# Patient Record
Sex: Male | Born: 1940 | Race: White | Hispanic: No | Marital: Married | Smoking: Former smoker
Health system: Southern US, Community
[De-identification: ages and names within clinical notes are randomized; demographics above are authoritative.]

## PROBLEM LIST (undated history)

## (undated) DIAGNOSIS — J449 Chronic obstructive pulmonary disease, unspecified: Secondary | ICD-10-CM

## (undated) DIAGNOSIS — E039 Hypothyroidism, unspecified: Secondary | ICD-10-CM

## (undated) DIAGNOSIS — G4730 Sleep apnea, unspecified: Secondary | ICD-10-CM

## (undated) DIAGNOSIS — I671 Cerebral aneurysm, nonruptured: Secondary | ICD-10-CM

## (undated) DIAGNOSIS — I219 Acute myocardial infarction, unspecified: Secondary | ICD-10-CM

## (undated) DIAGNOSIS — I10 Essential (primary) hypertension: Secondary | ICD-10-CM

## (undated) DIAGNOSIS — N400 Enlarged prostate without lower urinary tract symptoms: Secondary | ICD-10-CM

## (undated) DIAGNOSIS — I2510 Atherosclerotic heart disease of native coronary artery without angina pectoris: Secondary | ICD-10-CM

## (undated) DIAGNOSIS — C801 Malignant (primary) neoplasm, unspecified: Secondary | ICD-10-CM

## (undated) HISTORY — PX: CORONARY ANGIOPLASTY: SHX604

## (undated) HISTORY — PX: BLADDER SURGERY: SHX569

---

## 1999-11-06 DIAGNOSIS — I2510 Atherosclerotic heart disease of native coronary artery without angina pectoris: Secondary | ICD-10-CM

## 1999-11-06 HISTORY — DX: Atherosclerotic heart disease of native coronary artery without angina pectoris: I25.10

## 2007-10-29 ENCOUNTER — Encounter: Admission: RE | Admit: 2007-10-29 | Discharge: 2007-10-29 | Payer: Self-pay | Admitting: Neurosurgery

## 2007-11-06 DIAGNOSIS — I219 Acute myocardial infarction, unspecified: Secondary | ICD-10-CM

## 2007-11-06 HISTORY — DX: Acute myocardial infarction, unspecified: I21.9

## 2007-11-06 HISTORY — PX: CORONARY ARTERY BYPASS GRAFT: SHX141

## 2007-11-19 ENCOUNTER — Encounter: Payer: Self-pay | Admitting: Neurosurgery

## 2007-12-02 ENCOUNTER — Ambulatory Visit (HOSPITAL_COMMUNITY): Admission: RE | Admit: 2007-12-02 | Discharge: 2007-12-02 | Payer: Self-pay | Admitting: Interventional Radiology

## 2007-12-10 ENCOUNTER — Inpatient Hospital Stay (HOSPITAL_COMMUNITY): Admission: AD | Admit: 2007-12-10 | Discharge: 2007-12-11 | Payer: Self-pay | Admitting: Interventional Radiology

## 2007-12-30 ENCOUNTER — Encounter: Payer: Self-pay | Admitting: Interventional Radiology

## 2008-12-20 ENCOUNTER — Ambulatory Visit (HOSPITAL_COMMUNITY): Admission: RE | Admit: 2008-12-20 | Discharge: 2008-12-20 | Payer: Self-pay | Admitting: Interventional Radiology

## 2009-08-31 IMAGING — CT CT ANGIO HEAD
2 of 3 series · 11 of 30 positions shown · IV contrast (omnipaque)
Comparison: none

CLINICAL DATA: Aneurysm diagnosed by an MRI exam in [HOSPITAL].  There is no specific information about the location of the lesion on that exam.  
CT ANGIOGRAPHY OF HEAD:
TECHNIQUE: Multidetector CT imaging of the brain was performed during bolus injection of intravenous contrast.  Multiplanar CT angiographic image reconstructions were generated to evaluate cerebral vasculature centered at the circle of Willis.
Contrast:   cc Omnipaque 300

[Series 3: head w/(date) · axial · 0.49mm/px · z∈[-104,+76]mm · 8 of 48 slices shown]
[im 6/48  brain]
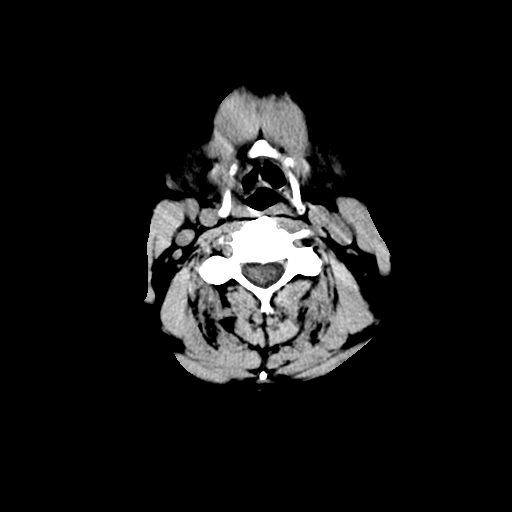
[im 11/48  bone]
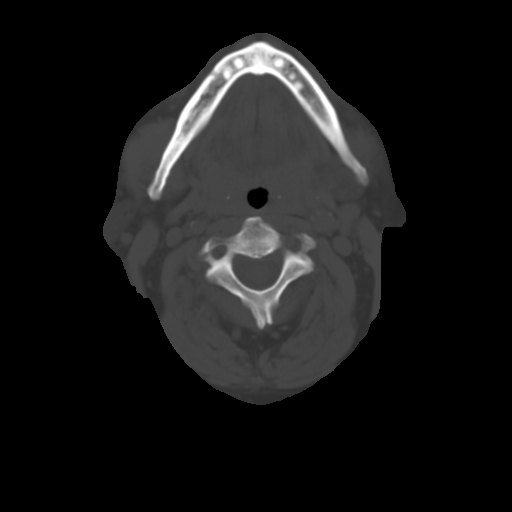
[im 16/48  brain]
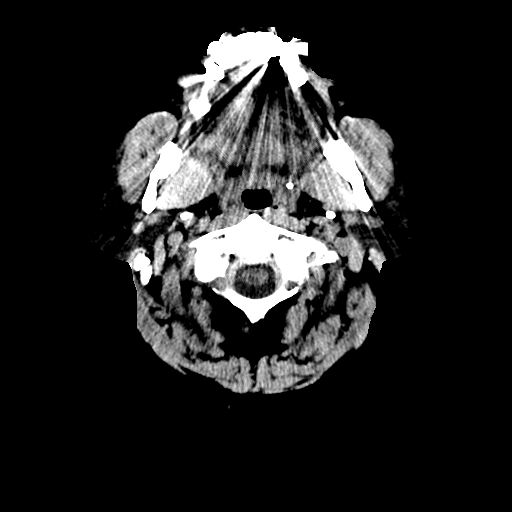
[im 21/48  bone]
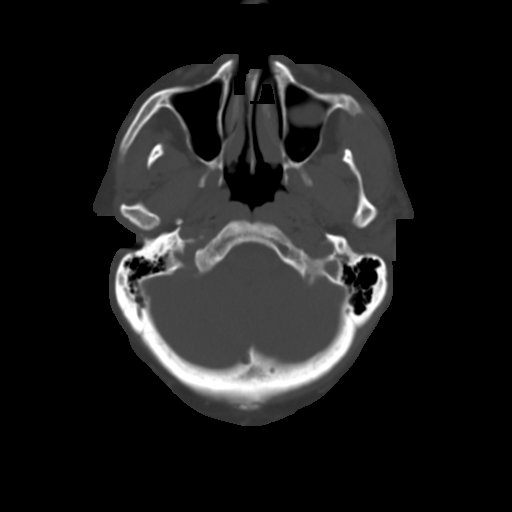
[im 27/48  brain]
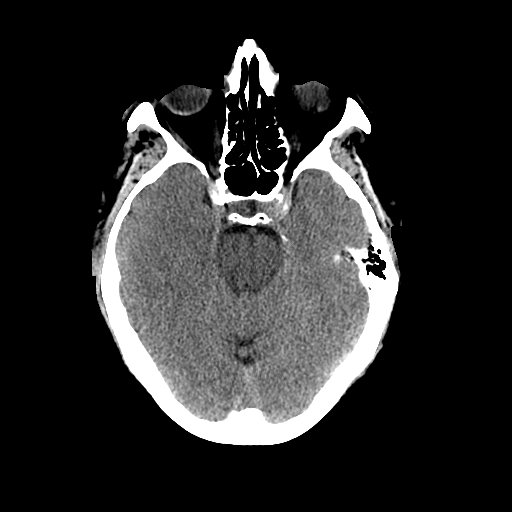
[im 32/48  bone]
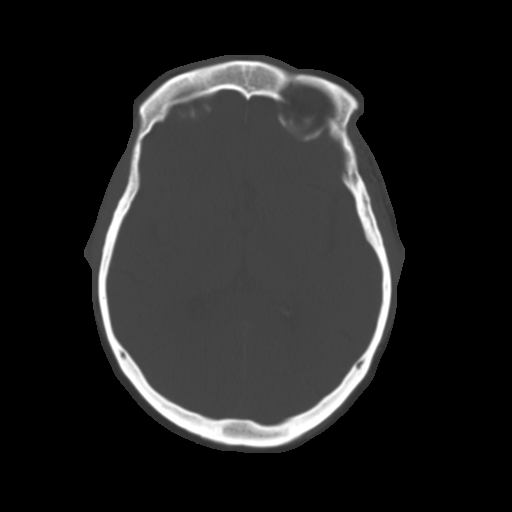
[im 37/48  brain]
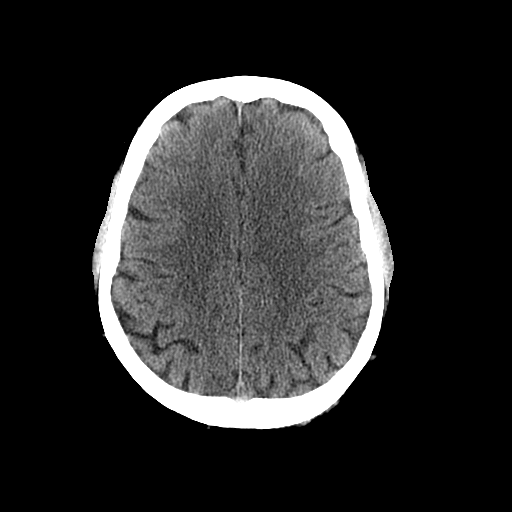
[im 42/48  bone]
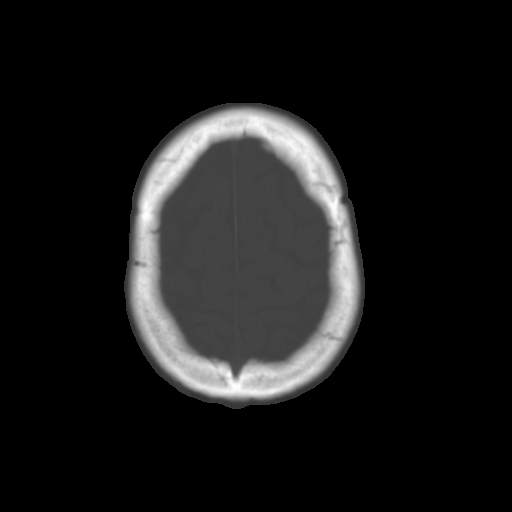

[Series 9: brain w/ · axial · 0.49mm/px · z∈[+25,+97]mm · 3 of 28 slices shown]
[im 7/28  brain]
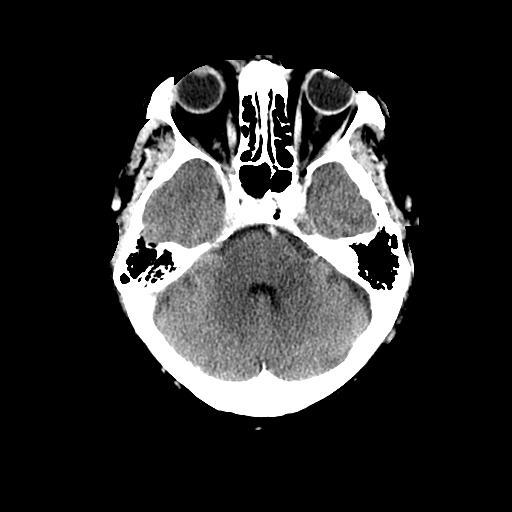
[im 14/28  brain]
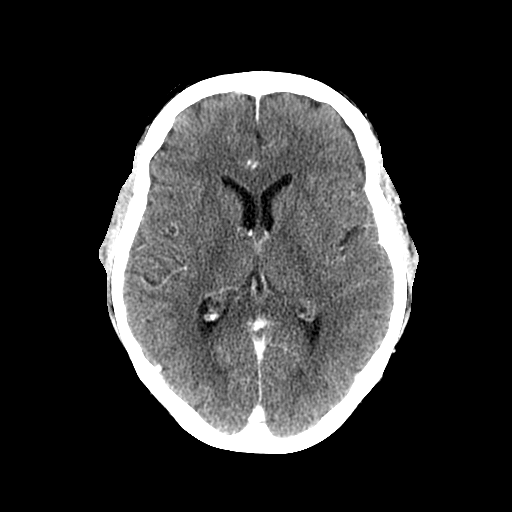
[im 21/28  brain]
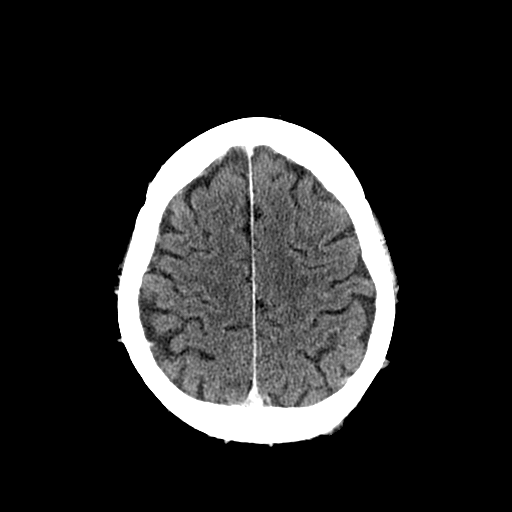

[11 of 30 positions shown; findings below may reference images not displayed]

FINDINGS: The brain itself shows no sign of accelerated atrophy or focal stroke.  Ventricular system appears normal.  No extraaxial fluid. 
Both vertebral arteries are widely patent and approximately equal in size.  There is mild atherosclerotic change of the right vertebral artery at the level of the foramen magnum but no flow limiting stenosis.  No basilar stenosis.  Posterior circulation branch vessels appear normal.  No posterior circulation aneurysm.  
The carotid bifurcations are incidentally included on the exam.  There is atherosclerotic disease at each carotid bifurcation.  Narrowing of the right ICA is maximally 20% by diameter stenosis.  The left ICA does not show a measurable luminal stenosis.  There is some atherosclerotic disease in the carotid siphon regions but no flow limiting stenosis evident.  Both anterior and middle cerebral vessels appear normal.  There is an anterior communicating artery aneurysm projecting upward and anteriorly measuring 12.4 x 8.5 x 8 mm.  The pericallosal artery on the left appears to exit directly from the aneurysm.
IMPRESSION: 12 x 8 x 8 mm anterior communicating artery projecting anteriorly.  The left pericallosal artery derives its origin from the aneurysm sac.  No second lesions are seen.

## 2010-11-26 ENCOUNTER — Encounter: Payer: Self-pay | Admitting: Neurosurgery

## 2010-11-26 ENCOUNTER — Encounter: Payer: Self-pay | Admitting: Interventional Radiology

## 2011-03-20 NOTE — Consult Note (Signed)
Richard [name], Richard [name] NO.:  [id number]   MEDICAL RECORD NO.:  [id number]          PATIENT TYPE:  OUT   LOCATION:  XRAY                         FACILITY:  [facility]   PHYSICIAN:  Sanjeev K. Deveshwar, M.D.DATE OF BIRTH:  1941-05-06   DATE OF CONSULTATION:  12/30/2007  DATE OF DISCHARGE:                                 CONSULTATION   REFERRING PHYSICIAN:  Cristi Loron, M.D.   DATE OF CONSULT:  December 30, 2007.   CHIEF COMPLAINT:  Status post stent assisted coiling of a large left  anterior communicating artery aneurysm performed December 10, 2007.   HISTORY OF PRESENT ILLNESS:  Mr. Shatswell is a very pleasant 70-year-  old male who was referred to Dr. [name] through the courtesy of Dr.  [name] after the patient was found to have a large anterior  communicating artery aneurysm which measured approximately 12 mm x 7.5  mm.  Dr. [name] met with the patient, in consultation, to discuss  treatment options.  The initial consult was performed on November 19, 2007.  Arrangements were made to have the patient return to [facility] on December 02, 2007 for treatment.  However, the patient was  somewhat anxious, at that time and a decision was made to proceed only  with the angiogram.  The angiogram did document the aneurysm as  previously noted, Dr. [name] did meet, again, with the patient in  consultation.  There was no charge for this followup visit.  At that  time the patient did decide that he definitely wanted to have the  aneurysm endovascularly treated.  He was admitted to [facility]  on December 10, 2007, and underwent stent assisted coiling of the  aneurysm without known or immediate complications.  The patient returns  today accompanied by a friend to be seen in followup.   PAST MEDICAL HISTORY SIGNIFICANT FOR:  1. Hypertension.  2. Hyperlipidemia.  3. Hypothyroidism.  4. A previous history of bladder cancer.  5. He has  a history of coronary artery disease with an MI last year      that required stent placement.  He is followed locally by Dr.      [name]  6. The patient had a the Persantine Cardiolite performed November 19, 2007, that was negative for ischemia.  His ejection fraction was      63%.   PAST SURGICAL HISTORY:  The patient has had coronary artery bypass graft  surgery, [place] believe, [place].  He has had resection  for bladder cancer.  He denies any previous problems with anesthesia.   ALLERGIES:  The patient is intolerant to codeine.   CURRENT MEDICATIONS INCLUDE:  Levothyroxine, Lipitor, atenolol, aspirin,  and Plavix.   SOCIAL HISTORY:  The patient is widowed.  He has one son.  He lives in  [place], [place].  He travels frequently.  He has a supportive  friend whom he refers to as [name].  He does have a history of tobacco  use.  He has been trying to quit.  He does not use alcohol.  He is semi-  retired.  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   FAMILY HISTORY:  His mother died at age 63 from congestive heart  failure.  His father died at age 29 from a subdural hematoma.  He has a  brother who died at age 42 from congestive heart failure.   IMPRESSION AND PLAN:  As noted, the patient returns today to be seen in  followup after his stent assisted coiling of an anterior communicating  artery aneurysm performed December 10, 2007.  Dr. [name] reviewed the  images of the aneurysm pre-and-post treatment with the patient and his  friend, [name].  All of their questions were answered.   The patient states he has had some mild occasional headaches.  He has  also noted some dizziness which is new.  His friend, [name], reports that  he has had some slurred speech, especially in the mornings although this  does appear to be improving.  The patient also reports that he has had  some numbness of his hands and his feet.  He also reports some  word-  finding difficulties.  They deny any facial weakness or any unilateral  weakness of his arms or legs.   The patient has had some weight loss.  He feels that he had some  emotional trauma because of all that he has been through recently.  He  is trying to give up tobacco.  He is down to smoking 5 cigarettes per  day.  He has been somewhat emotionally labile.   The patient is scheduled to followup with his primary care physician,  Dr. Leilani Able, tomorrow.  We have asked him to be sure to have his  blood pressure checked.  His dizziness might be coming from low blood  pressure or perhaps he is slightly dehydrated.  We have encouraged him  to drink plenty of fluids.  He does not particularly like drinking  water, but we have encouraged him to do so.  It could be that his  antihypertensive, atenolol, dosage might need to be adjusted also due to  his weight loss.  The patient might also be a candidate for an  antidepressant.  Again, we have advised him to discuss this with his  primary care physician.   Dr. [name] recommended that the patient continue on aspirin 325 mg  indefinitely.  He is to stay on the Plavix 75 mg daily for two more  weeks, then he is to change to Plavix 75 mg every other day for 2 weeks,  and then he will discontinue the Plavix.  We plan to repeat a cerebral  angiogram in approximately 4 months to further evaluate the status of  the stent, and the aneurysm which has been coiled.  The patient is in  agreement with this plan.  He has been told to call us, in the meantime,  if he has any other new neurological-type symptoms or any other  questions.  Greater than 40 minutes was spent on this visit.      Delton See, P.A.    ______________________________  Grandville Silos. [name], M.D.    DR/MEDQ  D:  12/30/2007  T:  12/30/2007  Job:  [id number]   cc:   Jocelyn Lamer D. Pecola Leisure, M.D.  Cristi Loron, M.D.
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
## 2011-07-26 LAB — DIFFERENTIAL
Basophils Absolute: 0.1
Basophils Relative: 1
Eosinophils Absolute: 0.2
Lymphocytes Relative: 9 — ABNORMAL LOW
Lymphs Abs: 0.7
Monocytes Absolute: 0.7
Neutro Abs: 6.2
Neutrophils Relative %: 79 — ABNORMAL HIGH

## 2011-07-26 LAB — BASIC METABOLIC PANEL
BUN: 15
Calcium: 8.7
Chloride: 106
GFR calc Af Amer: >60

## 2011-07-26 LAB — CBC
HCT: 44.4
Hemoglobin: 15.3
MCHC: 34.4
MCV: 91.3
RBC: 4.87
WBC: 7.8

## 2011-07-26 LAB — PROTIME-INR: Prothrombin Time: 12.5

## 2011-07-27 LAB — CBC
HCT: 34.7 — ABNORMAL LOW
Hemoglobin: 11.8 — ABNORMAL LOW
MCHC: 34
MCHC: 34.2
MCV: 90.6
MCV: 91.6
RBC: 3.79 — ABNORMAL LOW
RDW: 14.2
WBC: 8.9

## 2011-07-27 LAB — PROTIME-INR
INR: 0.9
INR: 1.1

## 2011-07-27 LAB — COMPREHENSIVE METABOLIC PANEL
ALT: 15
Albumin: 3.5
Alkaline Phosphatase: 57
BUN: 18
Chloride: 105
Potassium: 4.4
Sodium: 139
Total Bilirubin: 0.7
Total Protein: 6.5

## 2011-07-27 LAB — BASIC METABOLIC PANEL
CO2: 26
Calcium: 7.8 — ABNORMAL LOW
Chloride: 105
GFR calc Af Amer: >60
Glucose, Bld: 90
Potassium: 3.7
Sodium: 136

## 2012-05-14 ENCOUNTER — Telehealth (HOSPITAL_COMMUNITY): Payer: Self-pay

## 2012-05-14 NOTE — Telephone Encounter (Signed)
I spoke with Richard [name] in regards to his f/u angio.  He stated that he is consulting on a jobsite in [place] until Dec.  He will call once he gets back in town.

## 2015-04-07 ENCOUNTER — Other Ambulatory Visit (HOSPITAL_COMMUNITY)
Admission: RE | Admit: 2015-04-07 | Discharge: 2015-04-07 | Disposition: A | Discharge status: Home / Self Care | Payer: Medicare Other | Source: Ambulatory Visit

## 2016-10-01 ENCOUNTER — Ambulatory Visit: Payer: Self-pay | Admitting: Surgery

## 2016-10-01 NOTE — H&P (Signed)
Richard [name]. Viscomi 10/01/2016 10:41 AM Location: [facility] Patient #: [id number] DOB: 1941/07/31 Married / Language: English / Race: White Male  Patient Care Team: Michael Boston, [name] as Consulting Physician (General Surgery) Luanne Bras, [name] as Consulting Physician (Interventional Radiology) Newman Pies, [name] as Consulting Physician (Neurosurgery)   History of Present Illness Richard [name]; 10/01/2016 5:02 PM) The patient is a 75 year old male who presents with an inguinal hernia. Note for "Inguinal hernia": Patient sent for surgical consultation requested Dr. Lindon Romp from [place]. Concern for worsening left inguinal hernia.  Patient comes in today with his wife. He is usually out of town traveling for business, many weeks that time for [employer] estate. He went and got a physical examination while he was having a prolonged job in [place]. Has had a left inguinal hernia for a while. Since he is back home in [place], the hernia has been more sensitive. Surgical consultation requested. One was done in early December. They wish to be urgently seen. He was moved up to today. Notes up in [place] noticed surgeon saw him in August. Note states that he declined surgery. That annoyed him as he recalls being told observation only was appropriate. "It would be 10 years before I would need to consider hernia surgery." Initially was upset & about to leave, but then I tried to calm him down and reassure him.  Patient notes now the left groin lump is getting larger and bothering him. He is made a homemade truss that helps keep it in, but is still getting occasional pain and burning episodes. Worse after an episode of bad constipation. Bowels moving better now. He wished to consider surgery since its getting worse. No abndominal surgery. He did have cardiac bypass in the past. He still smokes but is down to at most a half a pack a day. Not on any  blood thinners. Not on even aspirin. He has never had a colonoscopy and he refuses it. He's never any history of skin infections. No abscesses that required incision and drainage. No MRSA.    Other Problems Illene [name], CMA; 10/01/2016 10:41 AM) Cancer Inguinal Hernia Myocardial infarction Thyroid Disease  Past Surgical History Richard [name], CMA; 10/01/2016 10:41 AM) Bypass Surgery for Poor Blood Flow to Legs Coronary Artery Bypass Graft  Diagnostic Studies History Richard [name], CMA; 10/01/2016 10:41 AM) Colonoscopy >10 years ago  Allergies Richard [name], CMA; 10/01/2016 10:42 AM) No Known Drug Allergies 10/01/2016  Medication History (Richard [name], CMA; 10/01/2016 10:42 AM) Synthroid (112MCG Tablet, Oral) Active. Medications Reconciled  Social History Illene [name], CMA; 10/01/2016 10:41 AM) Caffeine use Coffee. Tobacco use Current some day smoker.  Family History Illene [name], CMA; 10/01/2016 10:41 AM) Cancer Brother. Heart Disease Brother, Mother. Heart disease in male family member before age 65 Heart disease in male family member before age 31     Review of Systems Illene [name] CMA; 10/01/2016 10:41 AM) General Not Present- Appetite Loss, Chills, Fatigue, Fever, Night Sweats, Weight Gain and Weight Loss. Skin Not Present- Change in Wart/[name], Dryness, Hives, Jaundice, New Lesions, Non-Healing Wounds, Rash and Ulcer. HEENT Not Present- Earache, Hearing Loss, Hoarseness, Nose Bleed, Oral Ulcers, Ringing in the Ears, Seasonal Allergies, Sinus Pain, Sore Throat, Visual Disturbances, Wears glasses/contact lenses and Yellow Eyes. Respiratory Not Present- Bloody sputum, Chronic Cough, Difficulty Breathing, Snoring and Wheezing. Breast Not Present- Breast Mass, Breast Pain, Nipple Discharge and Skin Changes. Cardiovascular Not Present- Chest Pain, Difficulty Breathing Lying Down, Leg Cramps, Palpitations,  Rapid Heart Rate,  Shortness of Breath and Swelling of Extremities. Gastrointestinal Present- Change in Bowel Habits. Not Present- Abdominal Pain, Bloating, Bloody Stool, Chronic diarrhea, Constipation, Difficulty Swallowing, Excessive gas, Gets full quickly at meals, Hemorrhoids, Indigestion, Nausea, Rectal Pain and Vomiting. Male Genitourinary Not Present- Blood in Urine, Change in Urinary Stream, Frequency, Impotence, Nocturia, Painful Urination, Urgency and Urine Leakage.  Vitals (Richard [name] CMA; 10/01/2016 10:42 AM) 10/01/2016 10:41 AM Weight: 188 lb Height: 71in Body Surface Area: 2.05 m Body Mass Index: 26.22 kg/m  Pulse: 66 (Regular)  BP: 140/80 (Sitting, Left Arm, Standard)      Physical Exam Richard [name]; 10/01/2016 4:55 PM)  General Mental Status-Alert. General Appearance-Not in acute distress, Not Sickly. Orientation-Oriented X3. Hydration-Well hydrated. Voice-Normal.  Integumentary Global Assessment Upon inspection and palpation of skin surfaces of the - Axillae: non-tender, no inflammation or ulceration, no drainage. and Distribution of scalp and body hair is normal. General Characteristics Temperature - normal warmth is noted.  Head and Neck Head-normocephalic, atraumatic with no lesions or palpable masses. Face Global Assessment - atraumatic, no absence of expression. Neck Global Assessment - no abnormal movements, no bruit auscultated on the right, no bruit auscultated on the left, no decreased range of motion, non-tender. Trachea-midline. Thyroid Gland Characteristics - non-tender.  Eye Eyeball - Left-Extraocular movements intact, No Nystagmus. Eyeball - Right-Extraocular movements intact, No Nystagmus. Cornea - Left-No Hazy. Cornea - Right-No Hazy. Sclera/Conjunctiva - Left-No scleral icterus, No Discharge. Sclera/Conjunctiva - Right-No scleral icterus, No Discharge. Pupil - Left-Direct reaction to light normal. Pupil  - Right-Direct reaction to light normal.  ENMT Ears Pinna - Left - no drainage observed, no generalized tenderness observed. Right - no drainage observed, no generalized tenderness observed. Nose and Sinuses External Inspection of the Nose - no destructive lesion observed. Inspection of the nares - Left - quiet respiration. Right - quiet respiration. Mouth and Throat Lips - Upper Lip - no fissures observed, no pallor noted. Lower Lip - no fissures observed, no pallor noted. Nasopharynx - no discharge present. Oral Cavity/Oropharynx - Tongue - no dryness observed. Oral Mucosa - no cyanosis observed. Hypopharynx - no evidence of airway distress observed.  Chest and Lung Exam Inspection Movements - Normal and Symmetrical. Accessory muscles - No use of accessory muscles in breathing. Palpation Palpation of the chest reveals - Non-tender. Auscultation Breath sounds - Normal and Clear.  Cardiovascular Auscultation Rhythm - Regular. Murmurs & Other Heart Sounds - Auscultation of the heart reveals - No Murmurs and No Systolic Clicks.  Abdomen Inspection Inspection of the abdomen reveals - No Visible peristalsis and No Abnormal pulsations. Umbilicus - No Bleeding, No Urine drainage. Palpation/Percussion Palpation and Percussion of the abdomen reveal - Soft, Non Tender, No Rebound tenderness, No Rigidity (guarding) and No Cutaneous hyperesthesia. Note: Mild diastasis recti. Small umbilical hernia. Mild left subxiphoid bulging but no definite hernia. Can feel stitches from his prior CABG. Abdomen soft. Nontender, nondistended. No guarding. No umbilical no other hernias  Male Genitourinary Sexual Maturity Tanner 5 - Adult hair pattern and Adult penile size and shape. Note: Obvious left inguinal hernia. Sensitive but reducible. No definite right inguinal hernia. Normal external genitalia. Epididymi, testes, and spermatic cords normal without any masses.  Peripheral Vascular Upper  Extremity Inspection - Left - No Cyanotic nailbeds, Not Ischemic. Right - No Cyanotic nailbeds, Not Ischemic.  Neurologic Neurologic evaluation reveals -normal attention span and ability to concentrate, able to name objects and repeat phrases. Appropriate fund of knowledge , normal  sensation and normal coordination. Mental Status Affect - not angry, not paranoid. Cranial Nerves-Normal Bilaterally. Gait-Normal.  Neuropsychiatric Mental status exam performed with findings of-able to articulate well with normal speech/language, rate, volume and coherence, thought content normal with ability to perform basic computations and apply abstract reasoning and no evidence of hallucinations, delusions, obsessions or homicidal/suicidal ideation.  Musculoskeletal Global Assessment Spine, Ribs and Pelvis - no instability, subluxation or laxity. Right Upper Extremity - no instability, subluxation or laxity.  Lymphatic Head & Neck  General Head & Neck Lymphatics: Bilateral - Description - No Localized lymphadenopathy. Axillary  General Axillary Region: Bilateral - Description - No Localized lymphadenopathy. Femoral & Inguinal  Generalized Femoral & Inguinal Lymphatics: Left - Description - No Localized lymphadenopathy. Right - Description - No Localized lymphadenopathy.    Assessment & Plan Richard [name]; [phone] PM)  UMBILICAL HERNIA WITHOUT OBSTRUCTION AND WITHOUT GANGRENE (K42.9) Impression: Small but definite umbilical hernia with some mild diastases recti. Could benefit from concurrent primary repair.  I thought he might of had a hernia in the subxiphoid since he had a lump there but it looks like it is just stitches from his prior sternotomy incision and not truly a hernia there.  LEFT INGUINAL HERNIA (K40.90) Impression: Obvious left inguinal hernia. Sensitive but reducible. No strong evidence of right inguinal hernia, but somewhat sensitive. I would have a low  threshold to explore that as well.  Reasonable for laparoscopic preperitoneal approach. The patient had done a lot of online research about different types of surgeries. He had a lot of questions about techniques. concerns for mesh. While I noted that no technique is zero risk, risk of hernia recurrence is much less with mesh and repair is safe. I noted I worked hard to get good results with a laparoscopic preperitoneal approach and would not like to alter that unless there is a strong indication. It is reasonable for him to get a second opinion but by the end of the visit he felt comfortable to trust my opinion and have me as a Psychologist, sport and exercise.  I noted to him that his risks of surgery are far higher because he smokes than any use of mesh. I encouraged him to try and quit smoking. Don't know if he will but he will least consider it.  We got off on the wrong foot at first. I think a lot of it comes from the fact that the notes from the surgeon up in [place] through [facility] care of your where state that the patient declined surgery. I noted if the patient doesn't remember that way, I was not there saw have to trust with the patient says, just as he needs to trust my opinion. Had a long discussion with him. I think I won him over and he feels comfortable with proceeding with surgery. I gave him my card and noted if he has further questions or concerns to let me know. His wife is anxious to get this done as soon as possible. I noted that I already overbooked this late in the year, but I will see what I can do. He is not incarcerated or strangulated and he did not seem to be in unbearable pain today, but we will look for an opening earlier than usual  DIASTASIS RECTI (M62.08)  Current Plans Pt Education - CCS Diastasis Recti: discussed with patient and provided information. PREOP - ING HERNIA - ENCOUNTER FOR PREOPERATIVE EXAMINATION FOR GENERAL SURGICAL PROCEDURE (Z01.818)  Current Plans You are being  scheduled for  surgery - Our schedulers will call you.  You should hear from our office's scheduling department within 5 working days about the location, date, and time of surgery. We try to make accommodations for patient's preferences in scheduling surgery, but sometimes the OR schedule or the surgeon's schedule prevents [place] from making those accommodations.  If you have not heard from our office [phone]) in 5 working days, call the office and ask for your surgeon's nurse.  If you have other questions about your diagnosis, plan, or surgery, call the office and ask for your surgeon's nurse.  Written instructions provided The anatomy & physiology of the abdominal wall and pelvic floor was discussed. The pathophysiology of hernias in the inguinal and pelvic region was discussed. Natural history risks such as progressive enlargement, pain, incarceration, and strangulation was discussed. Contributors to complications such as smoking, obesity, diabetes, prior surgery, etc were discussed.  I feel the risks of no intervention will lead to serious problems that outweigh the operative risks; therefore, I recommended surgery to reduce and repair the hernia. I explained laparoscopic techniques with possible need for an open approach. I noted usual use of mesh to patch and/or buttress hernia repair  Risks such as bleeding, infection, abscess, need for further treatment, heart attack, death, and other risks were discussed. I noted a good likelihood this will help address the problem. Goals of post-operative recovery were discussed as well. Possibility that this will not correct all symptoms was explained. I stressed the importance of low-impact activity, aggressive pain control, avoiding constipation, & not pushing through pain to minimize risk of post-operative chronic pain or injury. Possibility of reherniation was discussed. We will work to minimize complications.  An educational handout further explaining the pathology  & treatment options was given as well. Questions were answered. The patient expresses understanding & wishes to proceed with surgery.  Pt Education - Pamphlet Given - Laparoscopic Hernia Repair: discussed with patient and provided information. Pt Education - CCS Pain Control (Merrel Crabbe) Pt Education - C[facility] Hernia Post-Op HCI (Airianna Kreischer): discussed with patient and provided information. TOBACCO ABUSE (Z72.0)  Current Plans Pt Education - CCS STOP SMOKING!  Richard Hector, M.D., F.A.C.S. Gastrointestinal and Minimally Invasive Surgery [facility] [address], [place], [place] [address] [phone] Main / Paging

## 2016-10-15 ENCOUNTER — Inpatient Hospital Stay (HOSPITAL_COMMUNITY): Admission: RE | Admit: 2016-10-15 | Payer: Medicare Other | Source: Ambulatory Visit

## 2016-10-18 ENCOUNTER — Ambulatory Visit (HOSPITAL_COMMUNITY): Admission: RE | Admit: 2016-10-18 | Payer: Medicare Other | Source: Ambulatory Visit | Admitting: Surgery

## 2016-10-18 ENCOUNTER — Encounter (HOSPITAL_COMMUNITY): Admission: RE | Payer: Self-pay | Source: Ambulatory Visit

## 2016-10-18 SURGERY — REPAIR, HERNIA, INGUINAL, BILATERAL, LAPAROSCOPIC
Anesthesia: General | Laterality: Bilateral

## 2016-10-19 ENCOUNTER — Other Ambulatory Visit: Payer: Self-pay | Admitting: Surgery

## 2016-11-09 ENCOUNTER — Encounter (HOSPITAL_COMMUNITY): Payer: Self-pay

## 2016-11-09 ENCOUNTER — Other Ambulatory Visit (HOSPITAL_COMMUNITY): Payer: Self-pay | Admitting: *Deleted

## 2016-11-09 ENCOUNTER — Encounter (HOSPITAL_COMMUNITY)
Admission: RE | Admit: 2016-11-09 | Discharge: 2016-11-09 | Disposition: A | Discharge status: Home / Self Care | Payer: Medicare Other | Source: Ambulatory Visit | Attending: Orthopaedic Surgery | Admitting: Orthopaedic Surgery

## 2016-11-09 ENCOUNTER — Encounter (HOSPITAL_COMMUNITY): Payer: Self-pay | Admitting: Vascular Surgery

## 2016-11-09 DIAGNOSIS — Z01810 Encounter for preprocedural cardiovascular examination: Secondary | ICD-10-CM | POA: Diagnosis present

## 2016-11-09 DIAGNOSIS — K4090 Unilateral inguinal hernia, without obstruction or gangrene, not specified as recurrent: Secondary | ICD-10-CM | POA: Diagnosis not present

## 2016-11-09 DIAGNOSIS — Z01812 Encounter for preprocedural laboratory examination: Secondary | ICD-10-CM | POA: Diagnosis not present

## 2016-11-09 DIAGNOSIS — I10 Essential (primary) hypertension: Secondary | ICD-10-CM | POA: Diagnosis not present

## 2016-11-09 HISTORY — DX: Malignant (primary) neoplasm, unspecified: C80.1

## 2016-11-09 HISTORY — DX: Chronic obstructive pulmonary disease, unspecified: J44.9

## 2016-11-09 HISTORY — DX: Hypothyroidism, unspecified: E03.9

## 2016-11-09 HISTORY — DX: Acute myocardial infarction, unspecified: I21.9

## 2016-11-09 HISTORY — DX: Sleep apnea, unspecified: G47.30

## 2016-11-09 HISTORY — DX: Essential (primary) hypertension: I10

## 2016-11-09 HISTORY — DX: Benign prostatic hyperplasia without lower urinary tract symptoms: N40.0

## 2016-11-09 HISTORY — DX: Cerebral aneurysm, nonruptured: I67.1

## 2016-11-09 HISTORY — DX: Atherosclerotic heart disease of native coronary artery without angina pectoris: I25.10

## 2016-11-09 LAB — CBC
HEMATOCRIT: 43.1 % (ref 39.0–52.0)
Hemoglobin: 14.6 g/dL (ref 13.0–17.0)
MCH: 30.5 pg (ref 26.0–34.0)
MCHC: 33.9 g/dL (ref 30.0–36.0)
MCV: 90 fL (ref 78.0–100.0)
PLATELETS: 230 10*3/uL (ref 150–400)
RBC: 4.79 MIL/uL (ref 4.22–5.81)
RDW: 14 % (ref 11.5–15.5)
WBC: 8 10*3/uL (ref 4.0–10.5)

## 2016-11-09 LAB — BASIC METABOLIC PANEL
Anion gap: 7 (ref 5–15)
BUN: 15 mg/dL (ref 6–20)
CO2: 29 mmol/L (ref 22–32)
CREATININE: 0.99 mg/dL (ref 0.61–1.24)
Calcium: 9.4 mg/dL (ref 8.9–10.3)
Chloride: 103 mmol/L (ref 101–111)
GFR calc Af Amer: >60 mL/min (ref >60)
GLUCOSE: 99 mg/dL (ref 65–99)
POTASSIUM: 3.7 mmol/L (ref 3.5–5.1)
Sodium: 139 mmol/L (ref 135–145)

## 2016-11-09 NOTE — Progress Notes (Signed)
Per records noted from Care Everywhere, pt has had a 1 vessel CABG and a stent sometime after. Pt states the CABG was somewhere around [place] in [place], [place] he thinks. Pt travels all over the country and has doctors in different places. Pt got agitated with me when I asked if he has a cardiologist and if he had had a recent stress test. He wanted to know why I was so concerned about his heart, he stated that "my heart is just fine, I had a complete check up a few months ago" I asked him if he was talking about his visits with Dr. Ellard Artis in [place], he stated "yes". I told him that Dr. Sabra Heck only saw him for stopping smoking and gave him the diagnosis of emphysema. Pt got more irritated and once again said " my heart is fine". He couldn't tell me the name of the hospital in [place], [place] that he went to and had a cardiac stent done in 2008 or 2009. Pt denies chest pain or sob. He says he's very healthy for a 76 year old. When asked if he was still taking Atenolol for his BP, he stated "when I think I need it" (he had not given that med to the pharmacy tech when they called for his medications). He then changed his answer to "I take it most days". Instructed pt to take it as instructed and to be sure to take it the day of surgery. Pt does state that he has quit smoking since May.

## 2016-11-09 NOTE — Pre-Procedure Instructions (Addendum)
TATE BUCKALEW  11/09/2016    Your procedure is scheduled on Monday, November 12, 2016 at 3:00 PM.   Report to [facility] Entrance "A" Admitting Office at 1:00 PM.   Call this number if you have problems the morning of surgery: [phone]   Remember:  Do not eat food or drink liquids after midnight Sunday, 11/11/16.  Take these medicines the morning of surgery with A SIP OF WATER: Levothyroxine (Synthroid)  Stop Multivitamins, Fish Oil and Garlic as of today. Do not use NSAIDS (Ibuprofen, Aleve, etc.) or Aspirin products prior to surgery.  Do NOT smoke 24 hours prior to surgery.   Do not wear jewelry.  Do not wear lotions, powders, or cologne.  Men may shave face and neck.  Do not bring valuables to the hospital.  [facility] is not responsible for any belongings or valuables.  Contacts, dentures or bridgework may not be worn into surgery.  Leave your suitcase in the car.  After surgery it may be brought to your room.  For patients admitted to the hospital, discharge time will be determined by your treatment team.  Patients discharged the day of surgery will not be allowed to drive home.   Special instructions:  [facility] - Preparing for Surgery  Before surgery, you can play an important role.  Because skin is not sterile, your skin needs to be as free of germs as possible.  You can reduce the number of germs on you skin by washing with CHG (chlorahexidine gluconate) soap before surgery.  CHG is an antiseptic cleaner which kills germs and bonds with the skin to continue killing germs even after washing.  Please DO NOT use if you have an allergy to CHG or antibacterial soaps.  If your skin becomes reddened/irritated stop using the CHG and inform your nurse when you arrive at Short Stay.  Do not shave (including legs and underarms) for at least 48 hours prior to the first CHG shower.  You may shave your face.  Please follow these instructions carefully:   1.  Shower  with CHG Soap the night before surgery and the                    morning of Surgery.  2.  If you choose to wash your hair, wash your hair first as usual with your       normal shampoo.  3.  After you shampoo, rinse your hair and body thoroughly to remove the shampoo.  4.  Use CHG as you would any other liquid soap.  You can apply chg directly       to the skin and wash gently with scrungie or a clean washcloth.  5.  Apply the CHG Soap to your body ONLY FROM THE NECK DOWN.        Do not use on open wounds or open sores.  Avoid contact with your eyes, ears, mouth and genitals (private parts).  Wash genitals (private parts) with your normal soap.  6.  Wash thoroughly, paying special attention to the area where your surgery        will be performed.  7.  Thoroughly rinse your body with warm water from the neck down.  8.  DO NOT shower/wash with your normal soap after using and rinsing off       the CHG Soap.  9.  Pat yourself dry with a clean towel.  10.  Wear clean pajamas.            11.  Place clean sheets on your bed the night of your first shower and do not        sleep with pets.  Day of Surgery  Do not apply any lotions the morning of surgery.  Please wear clean clothes to the hospital.   Please read over the fact sheets that you were given.

## 2016-11-09 NOTE — Progress Notes (Signed)
Anesthesia Chart Review: Patient is a 76 year old male scheduled for laparoscopic left inguinal hernia repair with possible right inguinal hernia repair with mesh, umbilical hernia repair on 11/12/16 by Dr. Coralie Keens. It looks like this case was initially scheduled on 10/18/16 with Dr. Johney Maine at [facility] but patient canceled prior to surgery (or PAT) to get a second opinion with Dr. Ninfa Linden. It had originally been rescheduled at [facility], but anesthesiologist Dr. Melrose Nakayama said case should be done in a hospital due to patient's known cardiac history. PAT was this morning.  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  Last primary care visit seen is with Ellard Artis, PA-C with [facility] in [place], [place]. 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 Because of his frequent travels, he has seem multiple different doctors in different states and facilities. He was unable to give specifics.   Meds include vitamin C, atenolol, garlic, levothyroxine, MVI, Nicoderm patch, fish oil.   BP (!) 148/71   Pulse 72   Temp 36.4 C   Resp 20   Ht 5' 11.25" (1.81 m)   Wt 192 lb 4.8 oz (87.2 kg)   SpO2 97%   BMI 26.63 kg/m   EKG 11/09/16: NSR, right BBB. There is no comparison tracings in [employer] or [facility].  By Dr. Ihor Gully notes from 12/10/07, "The patient is followed locally now by Dr. Shelva Majestic.  He had a Persantine Cardiolite performed November 19, 2007 in anticipation of the aneurysm treatment.  The Cardiolite was negative for ischemia.  His ejection fraction was 63%."  02/27/16 US Abdominal Aorta ([facility]; Care Everywhere): FINDINGS: Abdominal Aorta:Atherosclerotic irregularity in the lumen. No aneurysm.The  caliber is as follows: proximal 2.4 cm,mid 2.5 cm, and distal 2.3 cm.  Right Common Iliac Artery: Mild dilatation; it measures 1.5 cm.  Left Common Iliac Artery: Moderate dilatation; it measures 1.8 cm. IMPRESSION:   1.Atherosclerosis. No evidence of abdominal aortic aneurysm. 2. Dilatation of each common iliac artery as above.   CT Chest lung cancer screening 02/27/16 [facility]; Care Everywhere): IMPRESSION: 1. Scattered small and very small peripheral lung nodules. The Fleischner Society recommendations for a nodule measuring less than or equal to 4 mm [id number] Patient:Follow-up CT at 12 months; if unchanged, no further follow-up. 2. Bilateral emphysematous changes. 3. Atherosclerosis and previous CABG.  PFTs 03/22/16 [facility]; Care Everywhere): Spirometry shows moderately severe airflow obstruction. There is no significant improvement in FEV1 or FVC after bronchodilator. Flow volume loop: There is scooping of the expiratory limb of the flow volume loop Lung volumes: Total lung capacity is normal, Residual volume is normal and  RV/TLC is increased consistent with air-trapping. DLCO is mildly reduced, and is corrected for alveolar volume. 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  CBC and BMET today were WNL.   Reviewed above with anesthesiologist Dr. Linna Caprice. Patient with reported history of CAD, CABG, stent. He denied CP and SOB. He does not report a current PCP or cardiologist. Currently, last cardiology records that I have access to are from  2009. Surgery was moved from [facility] due to cardiac history, but it does not appear that anyone referred him for evaluation. Preoperative cardiology evaluation recommended. CCS  triage nurse Sunday Spillers notified.  George Hugh [facility] Short Stay Center/Anesthesiology Phone [phone] 11/09/2016 12:24 PM

## 2016-11-12 ENCOUNTER — Encounter (HOSPITAL_COMMUNITY): Admission: RE | Payer: Self-pay | Source: Ambulatory Visit

## 2016-11-12 ENCOUNTER — Ambulatory Visit (HOSPITAL_COMMUNITY): Admission: RE | Admit: 2016-11-12 | Payer: Medicare Other | Source: Ambulatory Visit | Admitting: Surgery

## 2016-11-12 SURGERY — REPAIR, HERNIA, INGUINAL, BILATERAL, LAPAROSCOPIC
Anesthesia: General

## 2016-11-13 ENCOUNTER — Telehealth: Payer: Self-pay | Admitting: Cardiology

## 2016-11-13 NOTE — Progress Notes (Deleted)
HPI: 76 yo male for preoperative evaluation prior to laparoscopic repair of left inguinal hernia.   Current Outpatient Prescriptions  Medication Sig Dispense Refill  . Ascorbic Acid (VITAMIN C) 1000 MG tablet Take 1,000 mg by mouth daily.    Marland Kitchen atenolol (TENORMIN) 25 MG tablet Take 25 mg by mouth daily. Pt takes it as needed    . GARLIC PO Take 2 tablets by mouth daily.    Marland Kitchen levothyroxine (SYNTHROID, LEVOTHROID) 150 MCG tablet Take 150 mcg by mouth daily before breakfast.    . Multiple Vitamin (MULTIVITAMIN WITH MINERALS) TABS tablet Take 1 tablet by mouth daily.    . nicotine (NICODERM CQ - DOSED IN MG/24 HOURS) 21 mg/24hr patch Place 21 mg onto the skin daily as needed (smoking cessation).    . Omega-3 Fatty Acids (FISH OIL) 1000 MG CAPS Take 1,000 mg by mouth daily.     No current facility-administered medications for this visit.     Allergies  Allergen Reactions  . No Known Allergies     Past Medical History:  Diagnosis Date  . BPH (benign prostatic hyperplasia)    from Care Everywhere notes - pt denies  . Brain aneurysm   . Cancer [facility]) 2003?   Bladder cancer  . COPD (chronic obstructive pulmonary disease) (HCC)    "pulmonary emphysema" from [facility]where notes - pt denies  . Coronary artery disease 2001   Cabg x 1 vessel - from Care Everywhere notes Pt states it was in [facility]  . Hypertension   . Hypothyroidism   . Myocardial infarction 2009   from Care Everywhere notes - 1 stent per pt [facility]  . Sleep apnea    no cpap    Past Surgical History:  Procedure Laterality Date  . BLADDER SURGERY     laser surgery for cancer  . CORONARY ANGIOPLASTY    . CORONARY ARTERY BYPASS GRAFT  2009    Social History   Social History  . Marital status: Married    Spouse name: N/A  . Number of children: N/A  . Years of education: N/A   Occupational History  . Not on file.   Social History Main Topics  . Smoking status: Former Smoker    Types:  Cigarettes    Quit date: 04/09/2016  . Smokeless tobacco: Never Used  . Alcohol use No  . Drug use: No  . Sexual activity: Not on file   Other Topics Concern  . Not on file   Social History Narrative  . No narrative on file    Family History  Problem Relation Age of Onset  . Congestive Heart Failure Mother   . Rheumatic fever Mother   . Cerebral aneurysm Father     ROS: no fevers or chills, productive cough, hemoptysis, dysphasia, odynophagia, melena, hematochezia, dysuria, hematuria, rash, seizure activity, orthopnea, PND, pedal edema, claudication. Remaining systems are negative.  Physical Exam:   There were no vitals taken for this visit.  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  A/P  1  Kirk Ruths, MD

## 2016-11-13 NOTE — Telephone Encounter (Signed)
Received records from [facility] for appointment on 11/15/16 with Dr Stanford Breed.  Records put with Dr Jacalyn Lefevre schedule for 11/15/16. lp

## 2016-11-15 ENCOUNTER — Ambulatory Visit: Payer: Medicare Other | Admitting: Cardiology

## 2016-11-15 ENCOUNTER — Encounter: Payer: Self-pay | Admitting: *Deleted
